# Patient Record
Sex: Female | Born: 1960 | Race: White | Hispanic: Yes | Marital: Married | Smoking: Current every day smoker
Health system: Southern US, Community
[De-identification: ages and names within clinical notes are randomized; demographics above are authoritative.]

## PROBLEM LIST (undated history)

## (undated) DIAGNOSIS — E785 Hyperlipidemia, unspecified: Secondary | ICD-10-CM

## (undated) DIAGNOSIS — J302 Other seasonal allergic rhinitis: Secondary | ICD-10-CM

## (undated) DIAGNOSIS — T7840XA Allergy, unspecified, initial encounter: Secondary | ICD-10-CM

## (undated) HISTORY — DX: Hyperlipidemia, unspecified: E78.5

## (undated) HISTORY — PX: OTHER SURGICAL HISTORY: SHX169

## (undated) HISTORY — PX: BILATERAL CARPAL TUNNEL RELEASE: SHX6508

## (undated) HISTORY — DX: Other seasonal allergic rhinitis: J30.2

## (undated) HISTORY — DX: Allergy, unspecified, initial encounter: T78.40XA

---

## 1992-10-19 HISTORY — PX: TUBAL LIGATION: SHX77

## 2002-10-19 HISTORY — PX: OTHER SURGICAL HISTORY: SHX169

## 2004-06-20 ENCOUNTER — Ambulatory Visit: Payer: Self-pay | Admitting: Family Medicine

## 2004-06-25 ENCOUNTER — Ambulatory Visit (HOSPITAL_COMMUNITY): Admission: RE | Admit: 2004-06-25 | Discharge: 2004-06-25 | Payer: Self-pay | Admitting: Family Medicine

## 2004-06-30 ENCOUNTER — Ambulatory Visit: Payer: Self-pay | Admitting: Family Medicine

## 2004-07-18 ENCOUNTER — Ambulatory Visit: Payer: Self-pay | Admitting: *Deleted

## 2004-10-07 ENCOUNTER — Ambulatory Visit: Payer: Self-pay | Admitting: Family Medicine

## 2004-10-27 ENCOUNTER — Ambulatory Visit (HOSPITAL_COMMUNITY): Admission: RE | Admit: 2004-10-27 | Discharge: 2004-10-27 | Payer: Self-pay | Admitting: Family Medicine

## 2005-12-11 ENCOUNTER — Ambulatory Visit: Payer: Self-pay | Admitting: Family Medicine

## 2006-01-07 ENCOUNTER — Ambulatory Visit: Payer: Self-pay | Admitting: Family Medicine

## 2006-01-08 ENCOUNTER — Other Ambulatory Visit: Admission: RE | Admit: 2006-01-08 | Discharge: 2006-01-08 | Payer: Self-pay | Admitting: Family Medicine

## 2006-01-08 ENCOUNTER — Encounter (INDEPENDENT_AMBULATORY_CARE_PROVIDER_SITE_OTHER): Payer: Self-pay | Admitting: Specialist

## 2006-07-05 ENCOUNTER — Ambulatory Visit: Payer: Self-pay | Admitting: Family Medicine

## 2006-07-09 ENCOUNTER — Ambulatory Visit (HOSPITAL_COMMUNITY): Admission: RE | Admit: 2006-07-09 | Discharge: 2006-07-09 | Payer: Self-pay | Admitting: Family Medicine

## 2006-07-30 ENCOUNTER — Encounter (INDEPENDENT_AMBULATORY_CARE_PROVIDER_SITE_OTHER): Payer: Self-pay | Admitting: Family Medicine

## 2006-07-30 ENCOUNTER — Ambulatory Visit: Payer: Self-pay | Admitting: Family Medicine

## 2006-11-01 ENCOUNTER — Ambulatory Visit: Payer: Self-pay | Admitting: Family Medicine

## 2006-11-03 ENCOUNTER — Ambulatory Visit (HOSPITAL_COMMUNITY): Admission: RE | Admit: 2006-11-03 | Discharge: 2006-11-03 | Payer: Self-pay | Admitting: Internal Medicine

## 2006-11-08 ENCOUNTER — Ambulatory Visit: Payer: Self-pay | Admitting: Family Medicine

## 2006-11-19 ENCOUNTER — Ambulatory Visit: Payer: Self-pay | Admitting: Family Medicine

## 2006-11-25 ENCOUNTER — Ambulatory Visit: Payer: Self-pay | Admitting: Internal Medicine

## 2006-12-02 ENCOUNTER — Ambulatory Visit: Payer: Self-pay | Admitting: Internal Medicine

## 2006-12-31 ENCOUNTER — Ambulatory Visit: Payer: Self-pay | Admitting: Family Medicine

## 2006-12-31 ENCOUNTER — Ambulatory Visit (HOSPITAL_COMMUNITY): Admission: RE | Admit: 2006-12-31 | Discharge: 2006-12-31 | Payer: Self-pay | Admitting: Family Medicine

## 2007-01-11 ENCOUNTER — Ambulatory Visit: Payer: Self-pay | Admitting: Family Medicine

## 2007-02-03 ENCOUNTER — Ambulatory Visit: Payer: Self-pay | Admitting: Family Medicine

## 2007-02-08 ENCOUNTER — Ambulatory Visit (HOSPITAL_COMMUNITY): Admission: RE | Admit: 2007-02-08 | Discharge: 2007-02-08 | Payer: Self-pay | Admitting: Family Medicine

## 2007-03-16 ENCOUNTER — Ambulatory Visit: Payer: Self-pay | Admitting: Family Medicine

## 2007-03-19 ENCOUNTER — Ambulatory Visit (HOSPITAL_COMMUNITY): Admission: RE | Admit: 2007-03-19 | Discharge: 2007-03-19 | Payer: Self-pay | Admitting: Family Medicine

## 2007-03-29 ENCOUNTER — Ambulatory Visit: Payer: Self-pay | Admitting: Family Medicine

## 2007-04-12 ENCOUNTER — Ambulatory Visit: Payer: Self-pay | Admitting: Family Medicine

## 2007-06-15 ENCOUNTER — Emergency Department (HOSPITAL_COMMUNITY): Admission: EM | Admit: 2007-06-15 | Discharge: 2007-06-16 | Payer: Self-pay | Admitting: Emergency Medicine

## 2007-07-06 ENCOUNTER — Encounter (INDEPENDENT_AMBULATORY_CARE_PROVIDER_SITE_OTHER): Payer: Self-pay | Admitting: *Deleted

## 2008-02-16 ENCOUNTER — Ambulatory Visit: Payer: Self-pay | Admitting: Internal Medicine

## 2008-03-19 ENCOUNTER — Encounter (INDEPENDENT_AMBULATORY_CARE_PROVIDER_SITE_OTHER): Payer: Self-pay | Admitting: Family Medicine

## 2008-03-19 ENCOUNTER — Ambulatory Visit: Payer: Self-pay | Admitting: Internal Medicine

## 2008-03-19 LAB — CONVERTED CEMR LAB
BUN: 16 mg/dL (ref 6–23)
CO2: 22 meq/L (ref 19–32)
Calcium: 9.4 mg/dL (ref 8.4–10.5)
Free T4: 1.25 ng/dL (ref 0.89–1.80)
Glucose, Bld: 97 mg/dL (ref 70–99)
Sodium: 141 meq/L (ref 135–145)
Total CHOL/HDL Ratio: 3.9
VLDL: 14 mg/dL (ref 0–40)

## 2008-03-26 ENCOUNTER — Ambulatory Visit (HOSPITAL_COMMUNITY): Admission: RE | Admit: 2008-03-26 | Discharge: 2008-03-26 | Payer: Self-pay | Admitting: Family Medicine

## 2008-05-22 ENCOUNTER — Ambulatory Visit: Payer: Self-pay | Admitting: Family Medicine

## 2008-05-28 ENCOUNTER — Ambulatory Visit (HOSPITAL_COMMUNITY): Admission: RE | Admit: 2008-05-28 | Discharge: 2008-05-28 | Payer: Self-pay | Admitting: Family Medicine

## 2009-02-04 ENCOUNTER — Ambulatory Visit: Payer: Self-pay | Admitting: Family Medicine

## 2009-10-22 ENCOUNTER — Ambulatory Visit: Payer: Self-pay | Admitting: Internal Medicine

## 2009-10-24 ENCOUNTER — Encounter: Admission: RE | Admit: 2009-10-24 | Discharge: 2009-10-24 | Payer: Self-pay | Admitting: Obstetrics and Gynecology

## 2010-11-12 ENCOUNTER — Other Ambulatory Visit (HOSPITAL_COMMUNITY): Payer: Self-pay | Admitting: Internal Medicine

## 2010-11-12 DIAGNOSIS — Z139 Encounter for screening, unspecified: Secondary | ICD-10-CM

## 2010-11-25 ENCOUNTER — Ambulatory Visit (HOSPITAL_COMMUNITY)
Admission: RE | Admit: 2010-11-25 | Discharge: 2010-11-25 | Disposition: A | Discharge status: Home / Self Care | Payer: Self-pay | Source: Ambulatory Visit | Attending: Internal Medicine | Admitting: Internal Medicine

## 2010-11-25 DIAGNOSIS — Z1231 Encounter for screening mammogram for malignant neoplasm of breast: Secondary | ICD-10-CM | POA: Insufficient documentation

## 2010-11-25 DIAGNOSIS — Z139 Encounter for screening, unspecified: Secondary | ICD-10-CM

## 2010-12-03 ENCOUNTER — Other Ambulatory Visit: Payer: Self-pay | Admitting: Internal Medicine

## 2010-12-03 DIAGNOSIS — R928 Other abnormal and inconclusive findings on diagnostic imaging of breast: Secondary | ICD-10-CM

## 2011-01-06 ENCOUNTER — Ambulatory Visit
Admission: RE | Admit: 2011-01-06 | Discharge: 2011-01-06 | Disposition: A | Discharge status: Home / Self Care | Payer: Self-pay | Source: Ambulatory Visit | Attending: Internal Medicine | Admitting: Internal Medicine

## 2011-01-06 ENCOUNTER — Other Ambulatory Visit: Payer: Self-pay | Admitting: Internal Medicine

## 2011-01-06 DIAGNOSIS — R928 Other abnormal and inconclusive findings on diagnostic imaging of breast: Secondary | ICD-10-CM

## 2011-07-31 LAB — I-STAT 8, (EC8 V) (CONVERTED LAB)
Acid-base deficit: 1
Operator id: 277751
Potassium: 3.3 — ABNORMAL LOW
TCO2: 20
pCO2, Ven: 24.1 — ABNORMAL LOW
pH, Ven: 7.522 — ABNORMAL HIGH

## 2011-07-31 LAB — D-DIMER, QUANTITATIVE: D-Dimer, Quant: 0.31

## 2011-07-31 LAB — POCT CARDIAC MARKERS
Myoglobin, poc: 44
Operator id: 277751
Operator id: 277751
Troponin i, poc: <0.05

## 2011-10-14 ENCOUNTER — Other Ambulatory Visit (HOSPITAL_COMMUNITY)
Admission: RE | Admit: 2011-10-14 | Discharge: 2011-10-14 | Disposition: A | Discharge status: Home / Self Care | Payer: Self-pay | Source: Ambulatory Visit | Attending: Dermatology | Admitting: Dermatology

## 2011-10-14 ENCOUNTER — Other Ambulatory Visit: Payer: Self-pay | Admitting: Family Medicine

## 2011-10-14 DIAGNOSIS — Z01419 Encounter for gynecological examination (general) (routine) without abnormal findings: Secondary | ICD-10-CM | POA: Insufficient documentation

## 2014-11-05 ENCOUNTER — Ambulatory Visit: Payer: Self-pay

## 2015-07-23 ENCOUNTER — Encounter (HOSPITAL_COMMUNITY): Payer: Self-pay | Admitting: Emergency Medicine

## 2015-07-23 ENCOUNTER — Emergency Department (HOSPITAL_COMMUNITY)
Admission: EM | Admit: 2015-07-23 | Discharge: 2015-07-24 | Disposition: A | Discharge status: Home / Self Care | Payer: Self-pay | Attending: Emergency Medicine | Admitting: Emergency Medicine

## 2015-07-23 ENCOUNTER — Emergency Department (HOSPITAL_COMMUNITY): Payer: Self-pay

## 2015-07-23 DIAGNOSIS — Z720 Tobacco use: Secondary | ICD-10-CM | POA: Insufficient documentation

## 2015-07-23 DIAGNOSIS — H02843 Edema of right eye, unspecified eyelid: Secondary | ICD-10-CM | POA: Insufficient documentation

## 2015-07-23 DIAGNOSIS — T782XXA Anaphylactic shock, unspecified, initial encounter: Secondary | ICD-10-CM

## 2015-07-23 DIAGNOSIS — H02846 Edema of left eye, unspecified eyelid: Secondary | ICD-10-CM | POA: Insufficient documentation

## 2015-07-23 DIAGNOSIS — X58XXXA Exposure to other specified factors, initial encounter: Secondary | ICD-10-CM | POA: Insufficient documentation

## 2015-07-23 DIAGNOSIS — R070 Pain in throat: Secondary | ICD-10-CM | POA: Insufficient documentation

## 2015-07-23 DIAGNOSIS — R002 Palpitations: Secondary | ICD-10-CM | POA: Insufficient documentation

## 2015-07-23 NOTE — ED Notes (Signed)
Pt. reports SOB and blaiteral eye lids swelling onset this evening , airway intact , O2 sat= 100% room air at arrival , no tongue swelling .

## 2015-07-24 MED ORDER — PREDNISONE 20 MG PO TABS
60.0000 mg | ORAL_TABLET | Freq: Once | ORAL | Status: AC
  Administered 2015-07-24: 60 mg via ORAL
  Filled 2015-07-24: qty 3

## 2015-07-24 MED ORDER — EPINEPHRINE 0.3 MG/0.3ML IJ SOAJ: 0.3000 mg | INTRAMUSCULAR | Status: DC | PRN

## 2015-07-24 MED ORDER — EPINEPHRINE 0.3 MG/0.3ML IJ SOAJ
0.3000 mg | Freq: Once | INTRAMUSCULAR | Status: AC
  Administered 2015-07-24: 0.3 mg via INTRAMUSCULAR
  Filled 2015-07-24: qty 0.3

## 2015-07-24 MED ORDER — DIPHENHYDRAMINE HCL 25 MG PO CAPS
50.0000 mg | ORAL_CAPSULE | Freq: Once | ORAL | Status: AC
  Administered 2015-07-24: 50 mg via ORAL
  Filled 2015-07-24: qty 2

## 2015-07-24 MED ORDER — PREDNISONE 20 MG PO TABS: 60.0000 mg | ORAL_TABLET | Freq: Every day | ORAL | Status: DC

## 2015-07-24 MED ORDER — RANITIDINE HCL 150 MG/10ML PO SYRP
300.0000 mg | ORAL_SOLUTION | Freq: Once | ORAL | Status: AC
  Administered 2015-07-24: 300 mg via ORAL
  Filled 2015-07-24: qty 20

## 2015-07-24 NOTE — ED Provider Notes (Signed)
CSN: [id number]     Arrival date & time 07/23/15  2236 History  By signing my name below, I, Freida Busman, attest that this documentation has been prepared under the direction and in the presence of Tomasita Crumble, MD . Electronically Signed: Freida Busman, Scribe. 07/24/2015. 1:19 AM.      Chief Complaint  Patient presents with  . Shortness of Breath   The history is provided by the patient. No language interpreter was used.    HPI Comments:  Tonya White is a 54 y.o. female who presents to the Emergency Department complaining of mild-moderate throat tightness and SOB that started about 2130 last night (07/23/15) while getting ready for bed. She reports associated swelling of her bilateral eyes and palpitations. She denies pain, itching, and rash. She also denies exposure to new chemicals at work, use of new lotions and detergents/soap. No alleviating factors noted. No history of allergic reactions; NKA.  History reviewed. No pertinent past medical history. History reviewed. No pertinent past surgical history. No family history on file. Social History  Substance Use Topics  . Smoking status: Current Every Day Smoker  . Smokeless tobacco: None  . Alcohol Use: No   OB History    No data available     Review of Systems  A complete 10 system review of systems was obtained and all systems are negative except as noted in the HPI and PMH.   Allergies  Review of patient's allergies indicates no known allergies.  Home Medications   Prior to Admission medications   Not on File   BP 122/71 mmHg  Pulse 69  Temp(Src) 98.1 F (36.7 C) (Oral)  Resp 16  SpO2 100% Physical Exam  Constitutional: She is oriented to person, place, and time. She appears well-developed and well-nourished. No distress.  HENT:  Head: Normocephalic and atraumatic.  Nose: Nose normal.  Mouth/Throat: Oropharynx is clear and moist. No oropharyngeal exudate.  No evidence of oral swelling; no uvula  deviance   Eyes: Conjunctivae and EOM are normal. Pupils are equal, round, and reactive to light. No scleral icterus.  There is swelling to the face diffusely worse around bilateral eyelids  Neck: Normal range of motion. Neck supple. No JVD present. No tracheal deviation present. No thyromegaly present.  Cardiovascular: Normal rate, regular rhythm and normal heart sounds.  Exam reveals no gallop and no friction rub.   No murmur heard. Pulmonary/Chest: Effort normal and breath sounds normal. No respiratory distress. She has no wheezes. She exhibits no tenderness.  Abdominal: Soft. Bowel sounds are normal. She exhibits no distension and no mass. There is no tenderness. There is no rebound and no guarding.  Musculoskeletal: Normal range of motion. She exhibits no edema or tenderness.  Lymphadenopathy:    She has no cervical adenopathy.  Neurological: She is alert and oriented to person, place, and time. No cranial nerve deficit. She exhibits normal muscle tone.  Skin: Skin is warm and dry. No rash noted. No erythema. No pallor.  Nursing note and vitals reviewed.   ED Course  Procedures   DIAGNOSTIC STUDIES:  Oxygen Saturation is 100% on RA, normal by my interpretation.    COORDINATION OF CARE:  12:38 AM Discussed treatment plan with pt at bedside and pt agreed to plan.  Labs Review Labs Reviewed - No data to display  Imaging Review Dg Chest 2 View  07/23/2015   CLINICAL DATA:  Short of breath and weakness with palpitations  EXAM: CHEST  2 VIEW  COMPARISON:  None.  FINDINGS: Normal heart size. Calcified granuloma in the superior segment of the left lower lobe. Otherwise clear lungs. No pneumothorax or pleural effusion.  IMPRESSION: No active cardiopulmonary disease.   Electronically Signed   By: Jolaine Click M.D.   On: 07/23/2015 23:57   I have personally reviewed and evaluated these images as part of my medical decision-making.   EKG Interpretation   Date/Time:  Tuesday July 23 2015 22:50:11 EDT Ventricular Rate:  88 PR Interval:  164 QRS Duration: 88 QT Interval:  372 QTC Calculation: 450 R Axis:   77 Text Interpretation:  Normal sinus rhythm Normal ECG No old tracing to  compare Confirmed by Erroll Luna [phone]) on 07/23/2015 11:46:33  PM      MDM   Final diagnoses:  None   Patient presents to the emergency department for an acute allergic reaction. She is unable to determine what she had an allergy to. She was advised to keep a watch for what might be the cause. She was given epinephrine injection, ranitidine, prednisone, Benadryl for treatment. Upon repeat evaluation approximately 3 hours later patient swelling in her face has improved. She states the swelling in her throat is completely gone. She'll be discharged with prednisone prescription for the next 4 days. Epinephrine pen prescription was given as well. Return precautions given, she appears well and in no acute distress, her vital signs remain within her normal limits and she is safe for discharge.    I, Wynston Romey, personally performed the services described in this documentation. All medical record entries made by the scribe were at my direction and in my presence.  I have reviewed the chart and discharge instructions and agree that the record reflects my personal performance and is accurate and complete. Adante [place]ington.  07/24/2015. 2:07 AM.     Tomasita Crumble, MD 07/24/15 [phone]

## 2015-07-24 NOTE — Discharge Instructions (Signed)
Reaccin anafilctica  (Anaphylactic Reaction)  Ms. Tonya White, take prednisone for the next 4 days to complete your treatment of the allergic reaction. Use the epinephrine pen if your symptoms come back and then come back to the emergency department immediately. See a primary care physician within 3 days for close follow-up. If any symptoms worsen come back to emergency department immediately. Thank you.   [name], tomar prednisona [employer] prximos 4 das para completar su tratamiento de la Risk analyst. Utilice el estuche de epinefrina si sus sntomas vuelven a aparecer y luego vuelven al departamento de emergencias. Consulte a un mdico de atencin primaria dentro de los 3 das de un seguimiento Physicist, medical. Si los sntomas empeoran cualquier vuelven a departamento de emergencias. Gracias.    La reaccin anafilctica es una reaccin alrgica sbita y grave. Afecta a todo el organismo. Puede poner en peligro la vida. Ser necesario que Music therapist hospital.  CUIDADOS EN EL HOGAR   Utilice un brazalete o collar de alerta mdico, indicando el tipo de [place].  Lleve con usted un kit para la alergia o un medicamento inyectable para tratar las Chief of Staff graves. Esto puede salvar su vida.  No conduzca vehculos hasta que los efectos de la inyeccin hayan desaparecido, excepto que el mdico lo autorice.  Si tiene urticaria o erupcin cutnea:  Tome los [employer] como le indic el mdico.  Puede tomar antihistamnicos de venta libre  Aplique paos fros sobre la piel. Tome un bao de agua fresca. Evite los [place] calientes. SOLICITE AYUDA DE INMEDIATO SI:   Tiene la boca inflamada (hinchada) o dificultad para respirar.  Comienza a emitir un sonido agudo al respirar (sibilancias).  Tiene una sensacin de opresin en el pecho o en la garganta.  Tiene un sarpullido, hinchazn o picazn en todo el cuerpo.  Vomita o tiene deposiciones  acuosas.(diarrea).  Se siente dbil o se desvanece (se desmaya).  Piensa que tiene Nurse, mental health.  Aparecen nuevos sntomas. Esto es Engineer, maintenance (IT). Utilice la inyeccin o el kit para la alergia segn las indicaciones. Llame a los servicios de emergencia locales (911 en [place]). Aunque despus de la inyeccin se sienta mejor, debe concurrir al servicio de emergencias del hospital.  ASEGRESE DE QUE:   Comprende estas instrucciones.  Controlar su enfermedad.  Solicitar ayuda de inmediato si no mejora o si empeora.   Esta informacin no tiene Marine scientist el consejo del mdico. Asegrese de hacerle al mdico cualquier pregunta que tenga.   Document Released: 04/05/2012 Elsevier Interactive Patient Education [employer].

## 2015-07-24 NOTE — ED Notes (Signed)
Gave pt Ginger Ale, per [place] - RN

## 2015-07-24 NOTE — ED Notes (Signed)
Pt c/o bilateral edema to eyelids, shortness of breath, and tightness in neck onset at 9pm. Pt became more concerned with swelling started around eyes. Denies visual disturbances. Pt also denies any new perfumes or laundry detergents

## 2017-01-01 ENCOUNTER — Emergency Department (HOSPITAL_COMMUNITY): Payer: Self-pay

## 2017-01-01 ENCOUNTER — Encounter (HOSPITAL_COMMUNITY): Payer: Self-pay

## 2017-01-01 DIAGNOSIS — H02841 Edema of right upper eyelid: Secondary | ICD-10-CM | POA: Insufficient documentation

## 2017-01-01 DIAGNOSIS — Z79899 Other long term (current) drug therapy: Secondary | ICD-10-CM | POA: Insufficient documentation

## 2017-01-01 DIAGNOSIS — R002 Palpitations: Secondary | ICD-10-CM | POA: Insufficient documentation

## 2017-01-01 DIAGNOSIS — F17200 Nicotine dependence, unspecified, uncomplicated: Secondary | ICD-10-CM | POA: Insufficient documentation

## 2017-01-01 LAB — BASIC METABOLIC PANEL
Anion gap: 12 (ref 5–15)
BUN: 15 mg/dL (ref 6–20)
CHLORIDE: 107 mmol/L (ref 101–111)
CO2: 22 mmol/L (ref 22–32)
CREATININE: 0.71 mg/dL (ref 0.44–1.00)
Calcium: 9.2 mg/dL (ref 8.9–10.3)
GFR calc Af Amer: >60 mL/min (ref >60)
GFR calc non Af Amer: >60 mL/min (ref >60)
GLUCOSE: 116 mg/dL — AB (ref 65–99)
POTASSIUM: 3.6 mmol/L (ref 3.5–5.1)
Sodium: 141 mmol/L (ref 135–145)

## 2017-01-01 LAB — I-STAT TROPONIN, ED: Troponin i, poc: 0 ng/mL (ref 0.00–0.08)

## 2017-01-01 LAB — CBC
HEMATOCRIT: 39.1 % (ref 36.0–46.0)
Hemoglobin: 12.8 g/dL (ref 12.0–15.0)
MCH: 29.6 pg (ref 26.0–34.0)
MCHC: 32.7 g/dL (ref 30.0–36.0)
MCV: 90.5 fL (ref 78.0–100.0)
Platelets: 327 10*3/uL (ref 150–400)
RBC: 4.32 MIL/uL (ref 3.87–5.11)
RDW: 13.5 % (ref 11.5–15.5)
WBC: 6.2 10*3/uL (ref 4.0–10.5)

## 2017-01-01 NOTE — ED Triage Notes (Signed)
Pt states that she went to check the mail and became very SOB and feels like her heart is beating really fast, pt states that she feels like her face is swollen, no visible swelling noted, pt also stated when she couldn't catch her breath she felt like her throat was tight, denies and new medications. Speaks in full sentences.

## 2017-01-02 ENCOUNTER — Emergency Department (HOSPITAL_COMMUNITY)
Admission: EM | Admit: 2017-01-02 | Discharge: 2017-01-02 | Disposition: A | Discharge status: Home / Self Care | Payer: Self-pay | Attending: Emergency Medicine | Admitting: Emergency Medicine

## 2017-01-02 DIAGNOSIS — R002 Palpitations: Secondary | ICD-10-CM

## 2017-01-02 DIAGNOSIS — H02843 Edema of right eye, unspecified eyelid: Secondary | ICD-10-CM

## 2017-01-02 LAB — I-STAT TROPONIN, ED: TROPONIN I, POC: 0 ng/mL (ref 0.00–0.08)

## 2017-01-02 MED ORDER — ERYTHROMYCIN 5 MG/GM OP OINT: TOPICAL_OINTMENT | OPHTHALMIC | 0 refills | Status: DC

## 2017-01-02 MED ORDER — CETIRIZINE HCL 10 MG PO TABS: 10.0000 mg | ORAL_TABLET | Freq: Every day | ORAL | 1 refills | Status: DC

## 2017-01-02 NOTE — ED Notes (Signed)
ED Provider at bedside. 

## 2017-01-02 NOTE — Discharge Instructions (Signed)
Follow up with a primary care doctor, the radiologist recommended a follow up xray in a few months to make sure an incidental nodule in the left upper lobe is not changing in size,   take the medications as prescribed, return as needed for worsening symptoms

## 2017-01-02 NOTE — ED Provider Notes (Signed)
[facility]-EMERGENCY DEPT Provider Note   CSN: [id number] Arrival date & time: 01/01/17  2253  By signing my name below, I, Nelwyn Salisbury, attest that this documentation has been prepared under the direction and in the presence of Linwood Dibbles, MD . Electronically Signed: Nelwyn Salisbury, Scribe. 01/02/2017. 2:56 AM.  History   Chief Complaint Chief Complaint  Patient presents with  . Shortness of Breath  . Chest Pain   The history is provided by the patient and a relative. The history is limited by a language barrier. No language interpreter was used.    HPI Comments:  Tonya White is an otherwise healthy 56 y.o. female who presents to the Emergency Department complaining of sudden-onset, constant, unchanged facial swelling which began about 6 hours ago. She notes that her swelling is localized to her eyelids. Pt reports associated SOB and chest palpitations. She states that she was walking to get the mail when her symptoms began. Pt also describes a sensation that her neck was throbbing. She denies any chest pain or abdominal pain.   History reviewed. No pertinent past medical history.  There are no active problems to display for this patient.   History reviewed. No pertinent surgical history.  OB History    No data available       Home Medications    Prior to Admission medications   Medication Sig Start Date End Date Taking? Authorizing Provider  cetirizine (ZYRTEC ALLERGY) 10 MG tablet Take 1 tablet (10 mg total) by mouth daily. 01/02/17   Linwood Dibbles, MD  EPINEPHrine 0.3 mg/0.3 mL IJ SOAJ injection Inject 0.3 mLs (0.3 mg total) into the muscle as needed (allergic reaction). 07/24/15   Tomasita Crumble, MD  erythromycin ophthalmic ointment Place a 1/2 inch ribbon of ointment into the lower eyelid. 01/02/17   Linwood Dibbles, MD  predniSONE (DELTASONE) 20 MG tablet Take 3 tablets (60 mg total) by mouth daily. 07/24/15   Tomasita Crumble, MD    Family History No family history on  file.  Social History Social History  Substance Use Topics  . Smoking status: Current Every Day Smoker  . Smokeless tobacco: Never Used  . Alcohol use No     Allergies   Patient has no known allergies.   Review of Systems Review of Systems  HENT: Positive for facial swelling.   Respiratory: Positive for shortness of breath.   Cardiovascular: Positive for palpitations. Negative for chest pain.  Gastrointestinal: Negative for abdominal pain.  All other systems reviewed and are negative.    Physical Exam Updated Vital Signs BP 123/63   Pulse (!) 55   Temp 98.1 F (36.7 C)   Resp 13   SpO2 98%   Physical Exam  Constitutional: She appears well-developed and well-nourished. No distress.  HENT:  Head: Normocephalic and atraumatic.  Right Ear: External ear normal.  Left Ear: External ear normal.  Eyes: Conjunctivae are normal. Right eye exhibits no discharge. Left eye exhibits no discharge. No scleral icterus.  Mild edema right upper eyelid  No exudate. No conjunctival injection.  Neck: Neck supple. No tracheal deviation present.  Cardiovascular: Normal rate, regular rhythm and intact distal pulses.   Pulmonary/Chest: Effort normal and breath sounds normal. No stridor. No respiratory distress. She has no wheezes. She has no rales.  Abdominal: Soft. Bowel sounds are normal. She exhibits no distension. There is no tenderness. There is no rebound and no guarding.  Musculoskeletal: She exhibits no edema or tenderness.  Neurological: She is alert.  She has normal strength. No cranial nerve deficit (no facial droop, extraocular movements intact, no slurred speech) or sensory deficit. She exhibits normal muscle tone. She displays no seizure activity. Coordination normal.  Skin: Skin is warm and dry. No rash noted.  Psychiatric: She has a normal mood and affect.  Nursing note and vitals reviewed.    ED Treatments / Results  DIAGNOSTIC STUDIES:  Oxygen Saturation is 98% on A,  normal by my interpretation.    COORDINATION OF CARE:  3:28 AM Discussed treatment plan with pt at bedside which includes antihistamines and repeat blood work and pt agreed to plan.  Labs (all labs ordered are listed, but only abnormal results are displayed) Labs Reviewed  BASIC METABOLIC PANEL - Abnormal; Notable for the following:       Result Value   Glucose, Bld 116 (*)    All other components within normal limits  CBC  I-STAT TROPOININ, ED  I-STAT TROPOININ, ED    EKG  EKG Interpretation  Date/Time:  Friday January 01 2017 22:59:50 EDT Ventricular Rate:  73 PR Interval:  200 QRS Duration: 88 QT Interval:  402 QTC Calculation: 442 R Axis:   55 Text Interpretation:  Normal sinus rhythm Normal ECG When compared with ECG of 06/15/2007, No significant change was found Confirmed by [facility] Mem HospGLICK  MD, DAVID ([id number]) on 01/02/2017 2:30:56 AM       Radiology Dg Chest 2 View  Result Date: 01/01/2017 CLINICAL DATA:  56 year old female with shortness of breath. EXAM: CHEST  2 VIEW COMPARISON:  None. FINDINGS: The lungs are clear. There is a 5 mm left upper lobe nodule, most likely a calcified granuloma. Direct comparison with prior images, if available, or follow-up recommended to document stability. There is no pleural effusion or pneumothorax. The cardiac silhouette is within normal limits. No acute osseous pathology identified. IMPRESSION: 1. No acute cardiopulmonary process. 2. Probable 5 mm left upper lobe granuloma. Direct comparison with prior images or follow-up recommended to document stability. Electronically Signed   By: Elgie CollardArash  Radparvar M.D.   On: 01/01/2017 23:45    Procedures Procedures (including critical care time)  Medications Ordered in ED Medications - No data to display   Initial Impression / Assessment and Plan / ED Course  I have reviewed the triage vital signs and the nursing notes.  Pertinent labs & imaging results that were available during my care of the patient  were reviewed by me and considered in my medical decision making (see chart for details).  Pt presented with complaints of palpitations. Denies any chest pain.  ED evaluation reassuring.  Normal EKG.  Also noted to have eyelid swelling.  No definite stye.  Will dc home with antihistamines,  Eye ointment.  Follow up with PCP  Final Clinical Impressions(s) / ED Diagnoses   Final diagnoses:  Chest pain, unspecified type  Eyelid edema, right    New Prescriptions New Prescriptions   CETIRIZINE (ZYRTEC ALLERGY) 10 MG TABLET    Take 1 tablet (10 mg total) by mouth daily.   ERYTHROMYCIN OPHTHALMIC OINTMENT    Place a 1/2 inch ribbon of ointment into the lower eyelid.  I personally performed the services described in this documentation, which was scribed in my presence.  The recorded information has been reviewed and is accurate.     Linwood DibblesJon Pio Eatherly, MD 01/02/17 [phone]

## 2019-01-30 ENCOUNTER — Other Ambulatory Visit: Payer: Self-pay

## 2019-01-30 ENCOUNTER — Other Ambulatory Visit: Payer: Self-pay | Admitting: Family Medicine

## 2019-01-30 ENCOUNTER — Ambulatory Visit: Payer: Self-pay

## 2019-01-30 DIAGNOSIS — M79605 Pain in left leg: Secondary | ICD-10-CM

## 2019-10-20 HISTORY — PX: MOUTH SURGERY: SHX715

## 2020-06-21 ENCOUNTER — Other Ambulatory Visit: Payer: Self-pay | Admitting: *Deleted

## 2020-06-21 ENCOUNTER — Other Ambulatory Visit: Payer: Self-pay

## 2020-06-21 DIAGNOSIS — Z20822 Contact with and (suspected) exposure to covid-19: Secondary | ICD-10-CM

## 2020-06-22 LAB — NOVEL CORONAVIRUS, NAA: SARS-CoV-2, NAA: NOT DETECTED

## 2020-06-25 ENCOUNTER — Telehealth: Payer: Self-pay | Admitting: Pediatric Intensive Care

## 2020-06-25 NOTE — Telephone Encounter (Signed)
Call to client with Tonya White, [employer] case manager. Client states that entire household has received COVID test results except her. Family members are all positive. She would like advise regarding quarantine. She also states that her husband Tonya White has MS. CN is familiar with her husband and advised that he call his PCP immediately to notify of COVID status. Shann Medal RN BSN CNP [phone]

## 2020-06-28 ENCOUNTER — Other Ambulatory Visit: Payer: Self-pay

## 2020-06-28 DIAGNOSIS — Z20822 Contact with and (suspected) exposure to covid-19: Secondary | ICD-10-CM

## 2020-07-01 LAB — NOVEL CORONAVIRUS, NAA: SARS-CoV-2, NAA: NOT DETECTED

## 2020-11-28 ENCOUNTER — Emergency Department (HOSPITAL_COMMUNITY)
Admission: EM | Admit: 2020-11-28 | Discharge: 2020-11-28 | Disposition: A | Discharge status: Home / Self Care | Payer: Self-pay | Attending: Emergency Medicine | Admitting: Emergency Medicine

## 2020-11-28 ENCOUNTER — Encounter (HOSPITAL_COMMUNITY): Payer: Self-pay | Admitting: Emergency Medicine

## 2020-11-28 ENCOUNTER — Other Ambulatory Visit: Payer: Self-pay

## 2020-11-28 DIAGNOSIS — R220 Localized swelling, mass and lump, head: Secondary | ICD-10-CM | POA: Insufficient documentation

## 2020-11-28 DIAGNOSIS — T7840XA Allergy, unspecified, initial encounter: Secondary | ICD-10-CM | POA: Insufficient documentation

## 2020-11-28 DIAGNOSIS — R0602 Shortness of breath: Secondary | ICD-10-CM | POA: Insufficient documentation

## 2020-11-28 DIAGNOSIS — F17200 Nicotine dependence, unspecified, uncomplicated: Secondary | ICD-10-CM | POA: Insufficient documentation

## 2020-11-28 MED ORDER — DEXAMETHASONE SODIUM PHOSPHATE 10 MG/ML IJ SOLN
10.0000 mg | Freq: Once | INTRAMUSCULAR | Status: AC
  Administered 2020-11-28: 10 mg via INTRAVENOUS
  Filled 2020-11-28: qty 1

## 2020-11-28 MED ORDER — EPINEPHRINE 0.3 MG/0.3ML IJ SOAJ
0.3000 mg | Freq: Once | INTRAMUSCULAR | Status: AC
  Administered 2020-11-28: 0.3 mg via INTRAMUSCULAR
  Filled 2020-11-28: qty 0.3

## 2020-11-28 MED ORDER — DIPHENHYDRAMINE HCL 50 MG/ML IJ SOLN
50.0000 mg | Freq: Once | INTRAMUSCULAR | Status: AC
  Administered 2020-11-28: 50 mg via INTRAVENOUS
  Filled 2020-11-28: qty 1

## 2020-11-28 MED ORDER — EPINEPHRINE 0.3 MG/0.3ML IJ SOAJ: 0.3000 mg | INTRAMUSCULAR | 2 refills | Status: AC | PRN

## 2020-11-28 NOTE — ED Triage Notes (Signed)
C/o eyes swelling and numbness in throat, with difficulty breathing. At present time - periorbital swelling--

## 2020-11-28 NOTE — ED Notes (Signed)
Pt is feeling calmer and states that the numb feeling in her face is improving.

## 2020-11-28 NOTE — ED Notes (Signed)
DC instructions reviewed with the pt.  PT verbalized understanding.  Pt DC.  

## 2020-11-28 NOTE — ED Provider Notes (Signed)
[facility] EMERGENCY DEPARTMENT Provider Note   CSN: [id number] Arrival date & time: 11/28/20  1525     History Chief Complaint  Patient presents with  . Shortness of Breath  . Allergic Reaction    Tonya White Tonya White is a 60 y.o. female with no significant history who presents to the ED for facial swelling. Patient in shower at approximately 1445 today when she noticed swelling around her eyes as well as numbness and scratching-sensation in throat. Felt somewhat SOB with symptoms. Denies wheezing, N/V/D, abdominal pain, rash, itchiness, or headache. No history of allergic reactions. No new exposures at home that patient is aware of. She has not taken anything for her symptoms. Patient in usual state of health prior to this incident.   The history is provided by the patient and medical records. The history is limited by a language barrier. A language interpreter was used.  Allergic Reaction Presenting symptoms: swelling   Presenting symptoms: no rash   Severity:  Mild Duration:  1 hour Prior allergic episodes:  No prior episodes Context comment:  Unknown - spontaneous Relieved by:  None tried Worsened by:  Nothing Ineffective treatments:  None tried   History reviewed. No pertinent past medical history.  There are no problems to display for this patient.   History reviewed. No pertinent surgical history.   OB History   No obstetric history on file.     No family history on file.  Social History   Tobacco Use  . Smoking status: Current Every Day Smoker  . Smokeless tobacco: Never Used  Substance Use Topics  . Alcohol use: No  . Drug use: No    Home Medications Prior to Admission medications   Medication Sig Start Date End Date Taking? Authorizing Provider  EPINEPHrine 0.3 mg/0.3 mL IJ SOAJ injection Inject 0.3 mg into the muscle as needed for anaphylaxis. 11/28/20  Yes Tonia Brooms, MD  cetirizine (ZYRTEC ALLERGY) 10 MG tablet Take 1  tablet (10 mg total) by mouth daily. 01/02/17   Linwood Dibbles, MD  EPINEPHrine 0.3 mg/0.3 mL IJ SOAJ injection Inject 0.3 mLs (0.3 mg total) into the muscle as needed (allergic reaction). 07/24/15   Tomasita Crumble, MD  erythromycin ophthalmic ointment Place a 1/2 inch ribbon of ointment into the lower eyelid. 01/02/17   Linwood Dibbles, MD  predniSONE (DELTASONE) 20 MG tablet Take 3 tablets (60 mg total) by mouth daily. 07/24/15   Tomasita Crumble, MD    Allergies    Patient has no known allergies.  Review of Systems   Review of Systems  Constitutional: Negative for chills and fever.  HENT: Positive for facial swelling. Negative for ear pain and sore throat.   Eyes: Positive for redness. Negative for pain, discharge, itching and visual disturbance.  Respiratory: Positive for shortness of breath. Negative for cough.   Cardiovascular: Negative for chest pain and palpitations.  Gastrointestinal: Negative for abdominal pain, diarrhea, nausea and vomiting.  Genitourinary: Negative for dysuria and hematuria.  Musculoskeletal: Negative for arthralgias and back pain.  Skin: Negative for color change and rash.  Neurological: Negative for seizures and syncope.  All other systems reviewed and are negative.   Physical Exam Updated Vital Signs BP 113/73 (BP Location: Left Arm)   Pulse 76   Temp 98.4 F (36.9 C) (Oral)   Resp 17   Ht 5' (1.524 m)   Wt 62.6 kg   SpO2 97%   BMI 26.95 kg/m   Physical Exam Vitals and  nursing note reviewed.  Constitutional:      General: She is not in acute distress.    Appearance: Normal appearance. She is well-developed and well-nourished. She is not ill-appearing or diaphoretic.  HENT:     Head: Normocephalic and atraumatic.     Jaw: There is normal jaw occlusion. No swelling.     Comments: BLT periorbital swelling. Able to open eyes. Conjunctival injection to R. No discharge.    Right Ear: External ear normal.     Left Ear: External ear normal.     Nose: Nose normal.  No congestion or rhinorrhea.     Mouth/Throat:     Lips: Pink. No lesions.     Mouth: Mucous membranes are moist. No oral lesions or angioedema.     Tongue: No lesions.     Palate: No lesions.     Pharynx: Oropharynx is clear. Uvula midline. No pharyngeal swelling, oropharyngeal exudate, posterior oropharyngeal erythema or uvula swelling.     Tonsils: No tonsillar exudate or tonsillar abscesses.  Eyes:     General:        Right eye: No discharge.        Left eye: No discharge.  Cardiovascular:     Rate and Rhythm: Normal rate and regular rhythm.     Heart sounds: No murmur heard.   Pulmonary:     Effort: Pulmonary effort is normal. No respiratory distress.     Breath sounds: Normal breath sounds. No stridor. No wheezing or rhonchi.  Chest:     Chest wall: No tenderness.  Abdominal:     General: Abdomen is flat. There is no distension.     Palpations: Abdomen is soft.     Tenderness: There is no abdominal tenderness. There is no guarding or rebound.  Musculoskeletal:        General: No edema.     Cervical back: Neck supple.     Right lower leg: No edema.     Left lower leg: No edema.  Skin:    General: Skin is warm and dry.     Findings: No rash.  Neurological:     General: No focal deficit present.     Mental Status: She is alert and oriented to person, place, and time.  Psychiatric:        Mood and Affect: Mood and affect and mood normal.        Behavior: Behavior normal.     ED Results / Procedures / Treatments   Labs (all labs ordered are listed, but only abnormal results are displayed) Labs Reviewed - No data to display  EKG None  Radiology No results found.  Procedures Procedures   Medications Ordered in ED Medications  EPINEPHrine (EPI-PEN) injection 0.3 mg (0.3 mg Intramuscular Given 11/28/20 1628)  dexamethasone (DECADRON) injection 10 mg (10 mg Intravenous Given 11/28/20 1627)  diphenhydrAMINE (BENADRYL) injection 50 mg (50 mg Intravenous Given  11/28/20 1627)    ED Course  I have reviewed the triage vital signs and the nursing notes.  Pertinent labs & imaging results that were available during my care of the patient were reviewed by me and considered in my medical decision making (see chart for details).    MDM Rules/Calculators/A&P                          Patient is a 59yF with a history and physical as described above who presents to the ED for anaphylaxis reaction.  VS reassuring and HDS. She is overall well-appearing, non-toxic, and no acute distress. Given presenting symptoms, IM epi x1, Solumedrol, and Benadryl given with significant improvement of symptoms.  Following 4-hour observation period following EpiPen administration, symptoms continued to improve. No complaint of neck swelling, trouble breathing, or other systemic symptoms since initial dose of Epi. HPI and exam consistent with anaphylactic reaction. Doubt SJS/TEN, angioedema, nephritic/nephrotic syndrome, sepsis, or trauma. Recommend patient continuing taking Benadryl scheduled at home for the next 24 hours. Will prescribe EpiPen for home use. Patient otherwise remained HDS and appropriate for discharge home. Recommend follow up with PCP for allergist referral. Strict return precautions provided and discussed. Questions and concerns were addressed. Patient verbalized understanding and amenable with discharge plan. Patient discharged in stable condition.  Final Clinical Impression(s) / ED Diagnoses Final diagnoses:  Allergic reaction, initial encounter    Rx / DC Orders ED Discharge Orders         Ordered    EPINEPHrine 0.3 mg/0.3 mL IJ SOAJ injection  As needed        11/28/20 2039           Tonia Brooms, MD 11/29/20 5643    Cathren Laine, MD 11/29/20 1440

## 2020-11-28 NOTE — ED Notes (Signed)
Pt felt her heart speed up and increased her respiratory rate as expected after epi-pen. Pt instructed to take slow deep breaths.

## 2020-11-28 NOTE — Discharge Instructions (Signed)
Please take Benadryl at home every 4-6 hours for the next 24 hours.

## 2020-11-28 NOTE — ED Triage Notes (Signed)
Has had all 3 covid vaccines

## 2021-03-15 ENCOUNTER — Emergency Department (HOSPITAL_COMMUNITY): Payer: Self-pay

## 2021-03-15 ENCOUNTER — Other Ambulatory Visit: Payer: Self-pay

## 2021-03-15 ENCOUNTER — Encounter (HOSPITAL_COMMUNITY): Payer: Self-pay | Admitting: Emergency Medicine

## 2021-03-15 ENCOUNTER — Emergency Department (HOSPITAL_COMMUNITY)
Admission: EM | Admit: 2021-03-15 | Discharge: 2021-03-15 | Disposition: A | Discharge status: Home / Self Care | Payer: Self-pay | Attending: Emergency Medicine | Admitting: Emergency Medicine

## 2021-03-15 DIAGNOSIS — F17200 Nicotine dependence, unspecified, uncomplicated: Secondary | ICD-10-CM | POA: Insufficient documentation

## 2021-03-15 DIAGNOSIS — M79672 Pain in left foot: Secondary | ICD-10-CM

## 2021-03-15 DIAGNOSIS — M25562 Pain in left knee: Secondary | ICD-10-CM

## 2021-03-15 MED ORDER — MELOXICAM 7.5 MG PO TABS: 7.5000 mg | ORAL_TABLET | Freq: Every day | ORAL | 0 refills | Status: AC

## 2021-03-15 NOTE — ED Triage Notes (Signed)
Pt tripped over a ramp and fell 2 weeks ago.  C/o pain to L foot and L knee.

## 2021-03-15 NOTE — ED Provider Notes (Addendum)
[facility] EMERGENCY DEPARTMENT Provider Note   CSN: [id number] Arrival date & time: 03/15/21  1044     History Chief Complaint  Patient presents with  . Foot Pain  . Knee Pain    Tonya White is a 60 y.o. female.  Patient presents the emergency department for evaluation of persistent left foot and left knee and lower leg pain starting about 2 weeks ago.  Patient tripped on a ramp while entering her house.  She sustained a small abrasion to her right great toe that bled.  This area is healed well.  She initially had pain on the bottom of her left foot that feels worse when she walks on it.  She states that it feels like there is a ball in her foot after she stands on it for a couple of hours.  Several days after that she developed pain in her proximal tibial area, medially.  This area is just sore.  She is able to ambulate.  She has been taking over-the-counter ibuprofen and Tylenol which helps the pain for about 4 hours and then it comes back.  No redness or color changes.  No numbness or tingling.  She has not seen anyone for this.  Onset of symptoms acute.  Patient is primarily Spanish-speaking.  Offered interpreter.  She voiced that she would like her son, who is at bedside, to interpret for her.        History reviewed. No pertinent past medical history.  There are no problems to display for this patient.   History reviewed. No pertinent surgical history.   OB History   No obstetric history on file.     No family history on file.  Social History   Tobacco Use  . Smoking status: Current Every Day Smoker  . Smokeless tobacco: Never Used  Substance Use Topics  . Alcohol use: No  . Drug use: No    Home Medications Prior to Admission medications   Medication Sig Start Date End Date Taking? Authorizing Provider  meloxicam (MOBIC) 7.5 MG tablet Take 1 tablet (7.5 mg total) by mouth daily. 03/15/21  Yes Renne Crigler, PA-C  EPINEPHrine  0.3 mg/0.3 mL IJ SOAJ injection Inject 0.3 mg into the muscle as needed for anaphylaxis. 11/28/20   Tonia Brooms, MD  cetirizine (ZYRTEC ALLERGY) 10 MG tablet Take 1 tablet (10 mg total) by mouth daily. 01/02/17 03/15/21  Linwood Dibbles, MD    Allergies    Patient has no known allergies.  Review of Systems   Review of Systems  Constitutional: Negative for activity change.  Musculoskeletal: Positive for arthralgias. Negative for joint swelling.  Skin: Negative for wound.  Neurological: Negative for weakness and numbness.    Physical Exam Updated Vital Signs BP 115/70 (BP Location: Right Arm)   Pulse 65   Temp 98.1 F (36.7 C) (Oral)   Resp 18   SpO2 98%   Physical Exam Vitals and nursing note reviewed.  Constitutional:      Appearance: She is well-developed.  HENT:     Head: Normocephalic and atraumatic.  Eyes:     Pupils: Pupils are equal, round, and reactive to light.  Cardiovascular:     Pulses: Normal pulses. No decreased pulses.  Musculoskeletal:        General: Tenderness present.     Cervical back: Normal range of motion and neck supple.     Right foot: No tenderness or bony tenderness.     Left foot:  Tenderness present. No bony tenderness.     Comments: R foot: healing abrasion great toe L foot: mild tenderness, mid-foot, plantar aspect. No ecchymosis, erythema. Left knee: There is mild tenderness over the proximal tibia, medially.  No ecchymosis or deformity.  No joint line tenderness.  Skin:    General: Skin is warm and dry.  Neurological:     Mental Status: She is alert.     Sensory: No sensory deficit.     Comments: Motor, sensation, and vascular distal to the injury is fully intact.      ED Results / Procedures / Treatments   Labs (all labs ordered are listed, but only abnormal results are displayed) Labs Reviewed - No data to display  EKG None  Radiology DG Knee 2 Views Left  Result Date: 03/15/2021 CLINICAL DATA:  Patient tripped twisting her left  foot 2 weeks ago. Complaining of left foot pain. Also with left knee pain. EXAM: LEFT KNEE - 1-2 VIEW COMPARISON:  None. FINDINGS: No evidence of fracture, dislocation, or joint effusion. No evidence of arthropathy or other focal bone abnormality. Soft tissues are unremarkable. IMPRESSION: Negative. Electronically Signed   By: Amie Portland M.D.   On: 03/15/2021 12:11   DG Foot Complete Left  Result Date: 03/15/2021 CLINICAL DATA:  Post fall. EXAM: LEFT FOOT - COMPLETE 3+ VIEW COMPARISON:  None. FINDINGS: There is no evidence of fracture or dislocation. There is no evidence of arthropathy or other focal bone abnormality. Soft tissues are unremarkable. IMPRESSION: Negative. Electronically Signed   By: Ted Mcalpine M.D.   On: 03/15/2021 13:34    Procedures Procedures   Medications Ordered in ED Medications - No data to display  ED Course  I have reviewed the triage vital signs and the nursing notes.  Pertinent labs & imaging results that were available during my care of the patient were reviewed by me and considered in my medical decision making (see chart for details).  Patient seen and examined.  Awaiting imaging results ordered at triage.  Vital signs reviewed and are as follows: BP 115/70 (BP Location: Right Arm)   Pulse 65   Temp 98.1 F (36.7 C) (Oral)   Resp 18   SpO2 98%   I had a call over to reading room to have one of the studies read.  Updated family and patient on results.  At this point no fractures.  Will place on a course of meloxicam.  Referral to podiatry given.  Patient encouraged to rest and avoid strenuous activities on her foot if possible.  We discussed gentle stretching with a tennis ball or other round object.  Encouraged podiatry/PCP follow-up, especially if symptoms are not improving.  She verbalizes understanding agrees with plan.    MDM Rules/Calculators/A&P                          Patient with left foot and knee pain starting after an injury 2  weeks ago.  Imaging is negative.  Lower extremity is neurovascularly intact.  Low concern for occult tibial plateau fracture.  Exam is concerning for sprain of the foot and contusion of the knee.  Treatment plan with RICE protolcol, NSAIDs, podiatry follow-up as above.  Patient looks well.   Final Clinical Impression(s) / ED Diagnoses Final diagnoses:  Left foot pain  Acute pain of left knee    Rx / DC Orders ED Discharge Orders         Ordered  meloxicam (MOBIC) 7.5 MG tablet  Daily        03/15/21 1356             Renne Crigler, [place] 03/15/21 1530    Tegeler, Canary Brim, MD 03/15/21 1537

## 2021-03-15 NOTE — Discharge Instructions (Signed)
Please read and follow all provided instructions.  Your diagnoses today include:  1. Left foot pain   2. Acute pain of left knee     Tests performed today include:  An x-ray of the affected area - does NOT show any broken bones  Vital signs. See below for your results today.   Medications prescribed:   Meloxicam - anti-inflammatory pain medication  You have been prescribed an anti-inflammatory medication or NSAID. Take with food. Do not take aspirin, ibuprofen, or naproxen if taking this medication. Take smallest effective dose for the shortest duration needed for your pain. Stop taking if you experience stomach pain or vomiting.   Take any prescribed medications only as directed.  Home care instructions:   Follow any educational materials contained in this packet  Follow R.I.C.E. Protocol:  R - rest your injury   I  - use ice on injury without applying directly to skin  C - compress injury with bandage or splint  E - elevate the injury as much as possible  Follow-up instructions: Please follow-up with the provided podiatrist (bone specialist) if you continue to have significant pain in 1 week. In this case you may have a more severe injury that requires further care.   Return instructions:   Please return if your toes or feet are numb or tingling, appear gray or blue, or you have severe pain (also elevate the leg and loosen splint or wrap if you were given one)  Please return to the Emergency Department if you experience worsening symptoms.   Please return if you have any other emergent concerns.  Additional Information:  Your vital signs today were: BP 115/70 (BP Location: Right Arm)   Pulse 65   Temp 98.1 F (36.7 C) (Oral)   Resp 18   SpO2 98%  If your blood pressure (BP) was elevated above 135/85 this visit, please have this repeated by your doctor within one month. --------------

## 2021-03-15 NOTE — ED Provider Notes (Signed)
Emergency Medicine Provider Triage Evaluation Note  Tonya White , a 60 y.o. female  was evaluated in triage.  Pt complains of foot injury.  Review of Systems  Positive: L foot pain, L knee pain Negative: Hip pain, numbness  Physical Exam  BP 115/70 (BP Location: Right Arm)   Pulse 65   Temp 98.1 F (36.7 C) (Oral)   Resp 18   SpO2 98%  Gen:   Awake, no distress   Resp:  Normal effort  MSK:   Moves extremities without difficulty  Other:  L foot: ttp to mid sole, no deformity.  L knee, mild anterior tenderness  Medical Decision Making  Medically screening exam initiated at 11:30 AM.  Appropriate orders placed.  Tonya White was informed that the remainder of the evaluation will be completed by another provider, this initial triage assessment does not replace that evaluation, and the importance of remaining in the ED until their evaluation is complete.  Tripped on ramp and fell 2 weeks ago.  Progressive worsening pain to L foot and L knee.    Fayrene Helper, PA-C 03/15/21 1131    Horton, Clabe Seal, DO 03/15/21 1517

## 2022-07-18 IMAGING — CR DG FOOT COMPLETE 3+V*L*
3 series · 3 of 3 positions shown · non-contrast
Comparison: None.

CLINICAL DATA: Post fall.

EXAM:
LEFT FOOT - COMPLETE 3+ VIEW

[foot ap]
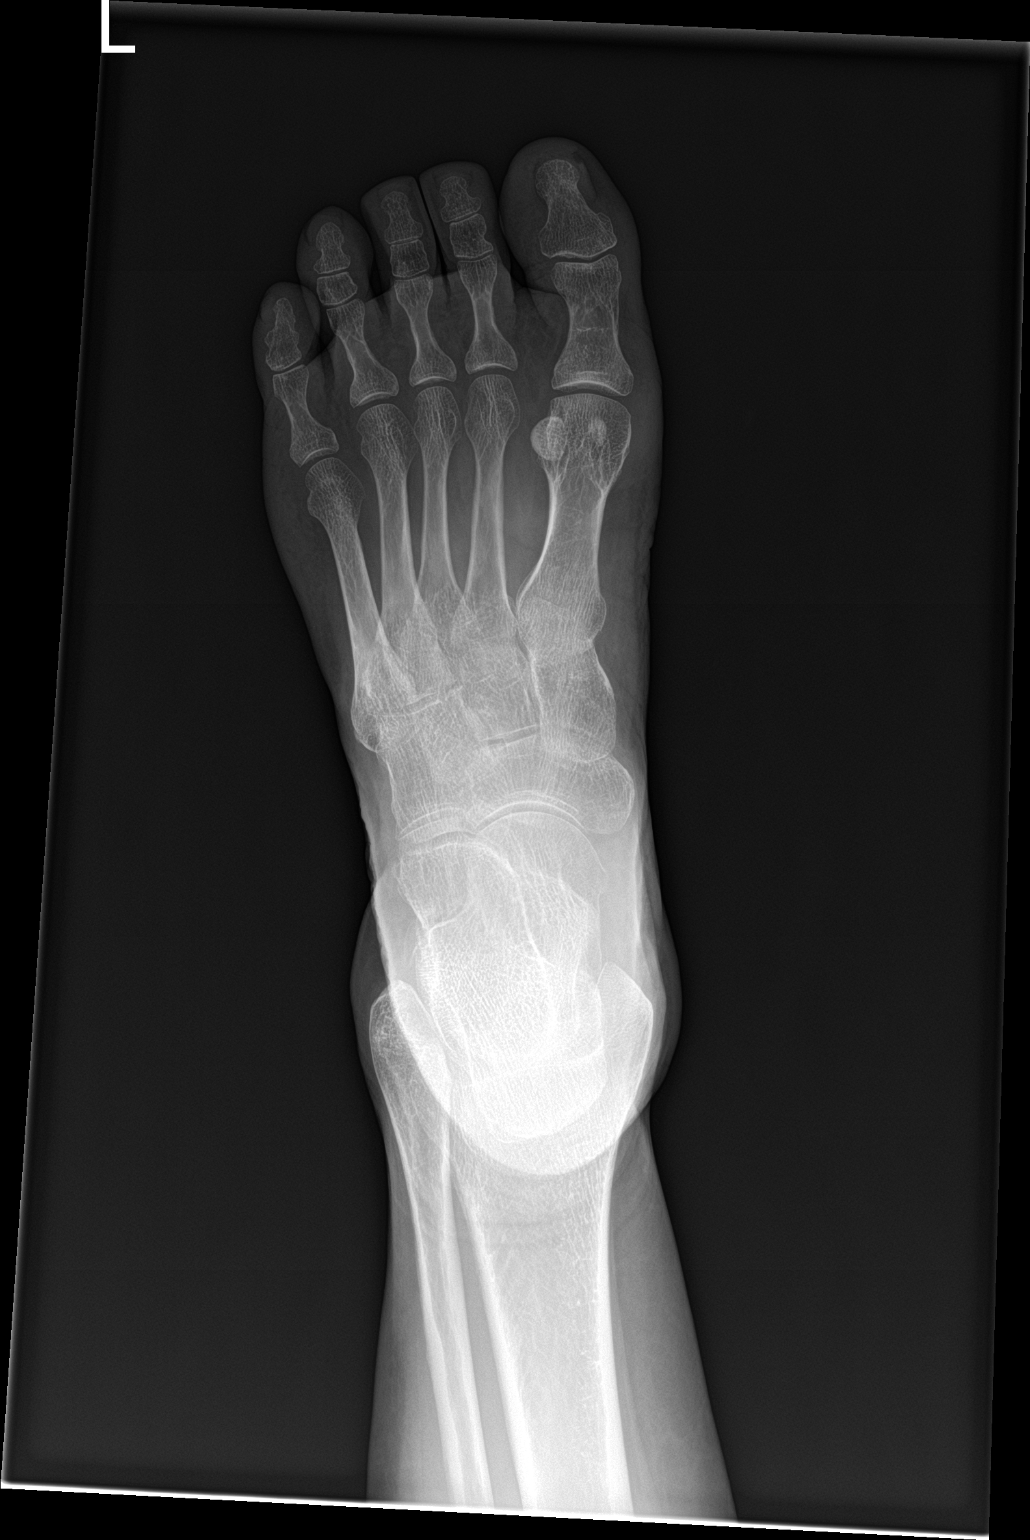

[foot obl]
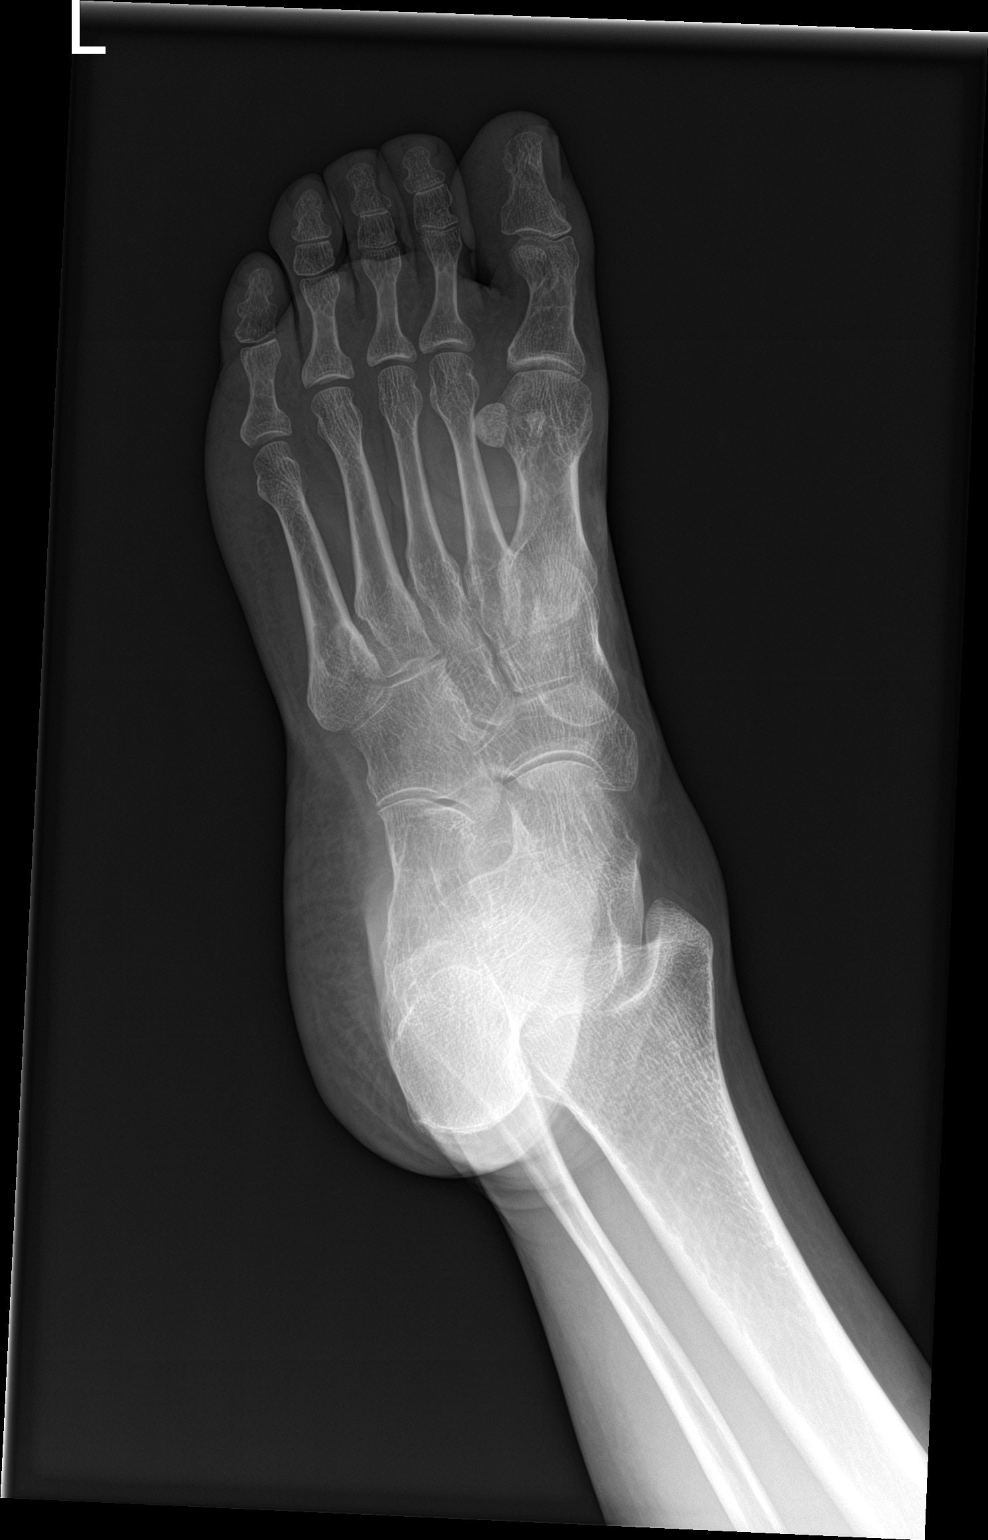

[foot lat]
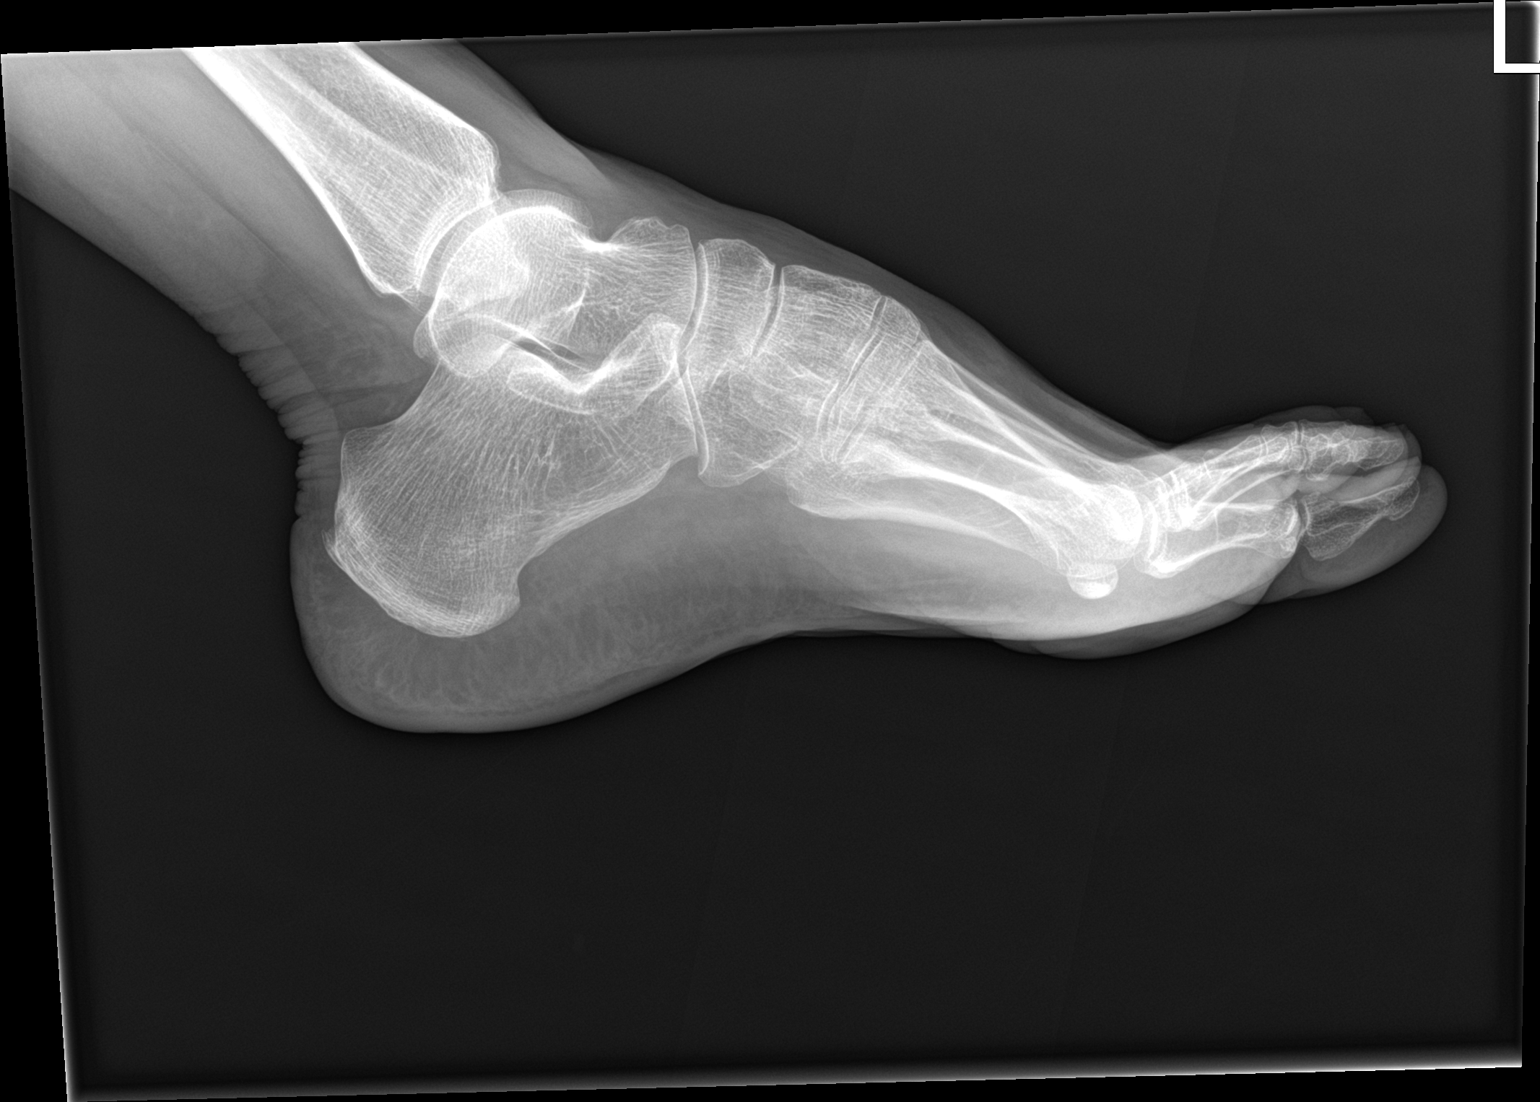

[3 of 3 positions shown; findings below may reference images not displayed]

FINDINGS: There is no evidence of fracture or dislocation. There is no
evidence of arthropathy or other focal bone abnormality. Soft
tissues are unremarkable.
IMPRESSION: Negative.

## 2022-07-18 IMAGING — CR DG KNEE 1-2V*L*
3 series · 3 of 3 positions shown · non-contrast
Comparison: None.

CLINICAL DATA: Patient tripped twisting her left foot 2 weeks ago.
Complaining of left foot pain. Also with left knee pain.

EXAM:
LEFT KNEE - 1-2 VIEW

[knee ap]
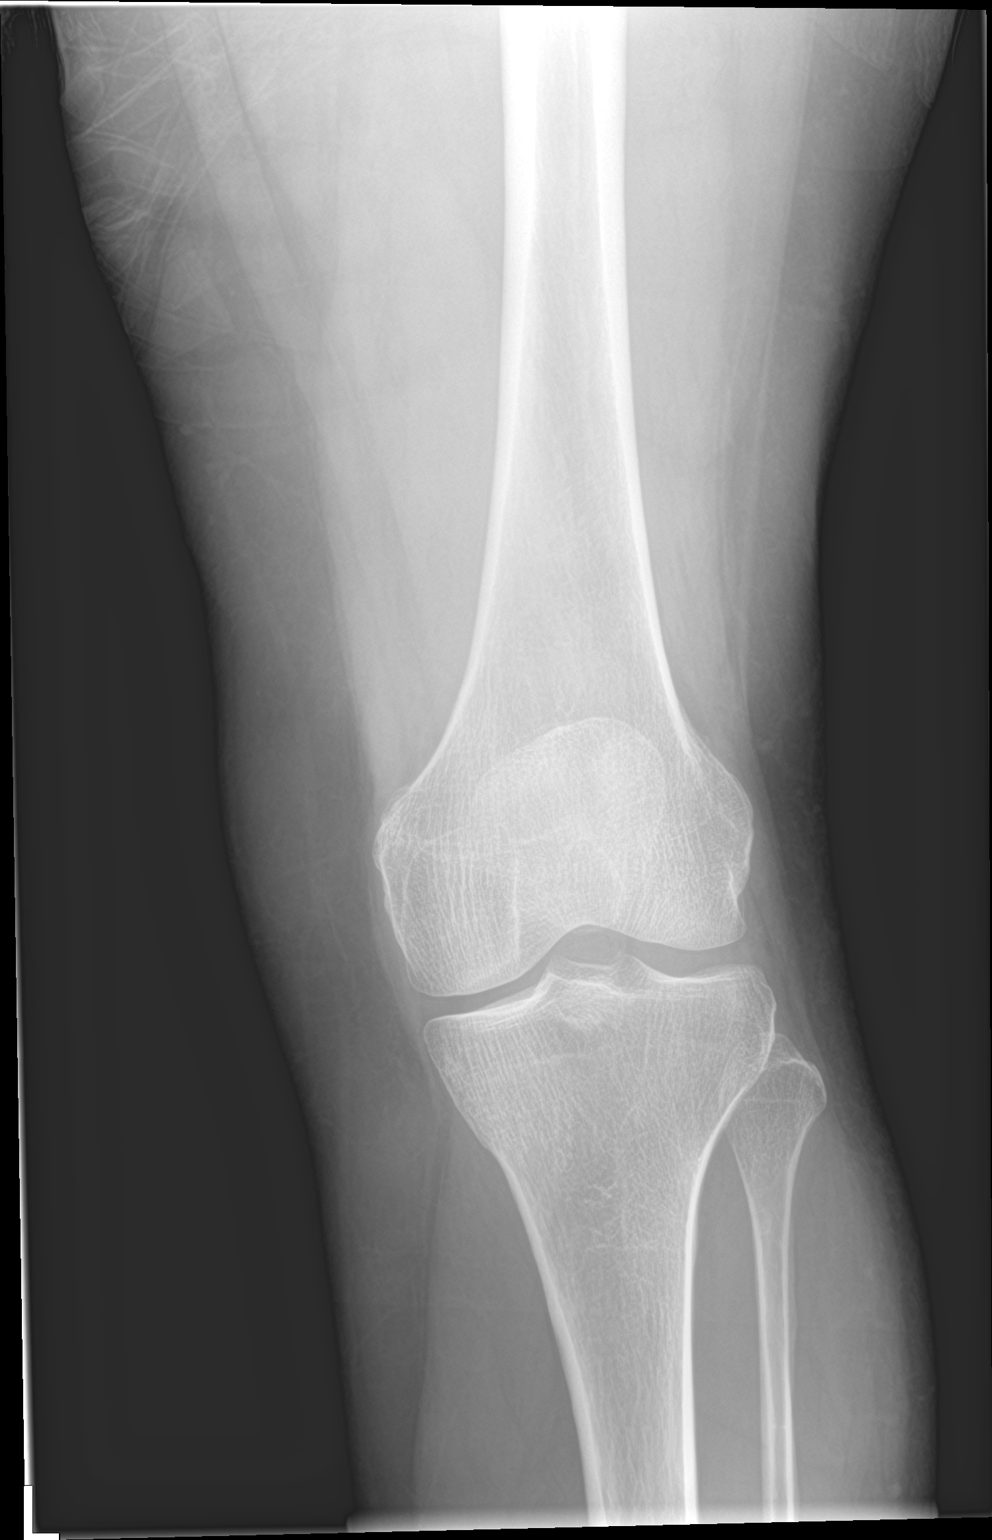

[knee lat (1 of 2)]
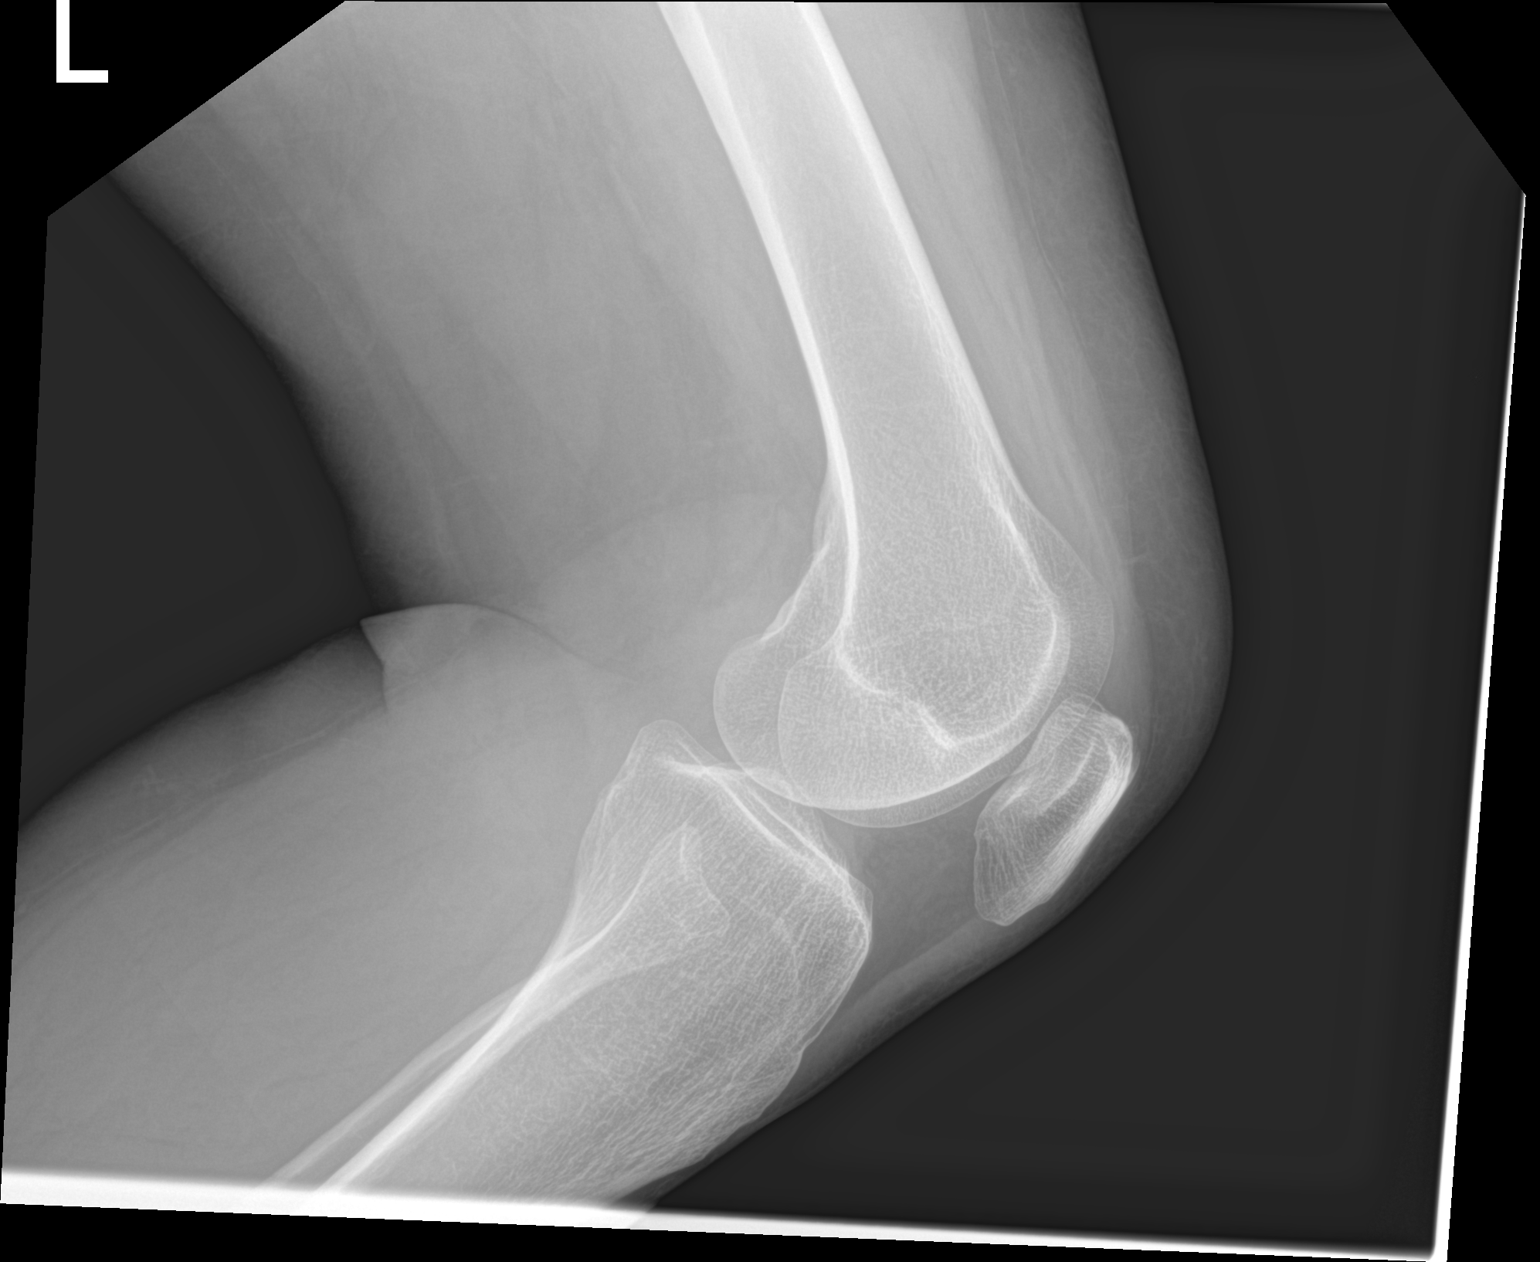

[knee lat (2 of 2)]
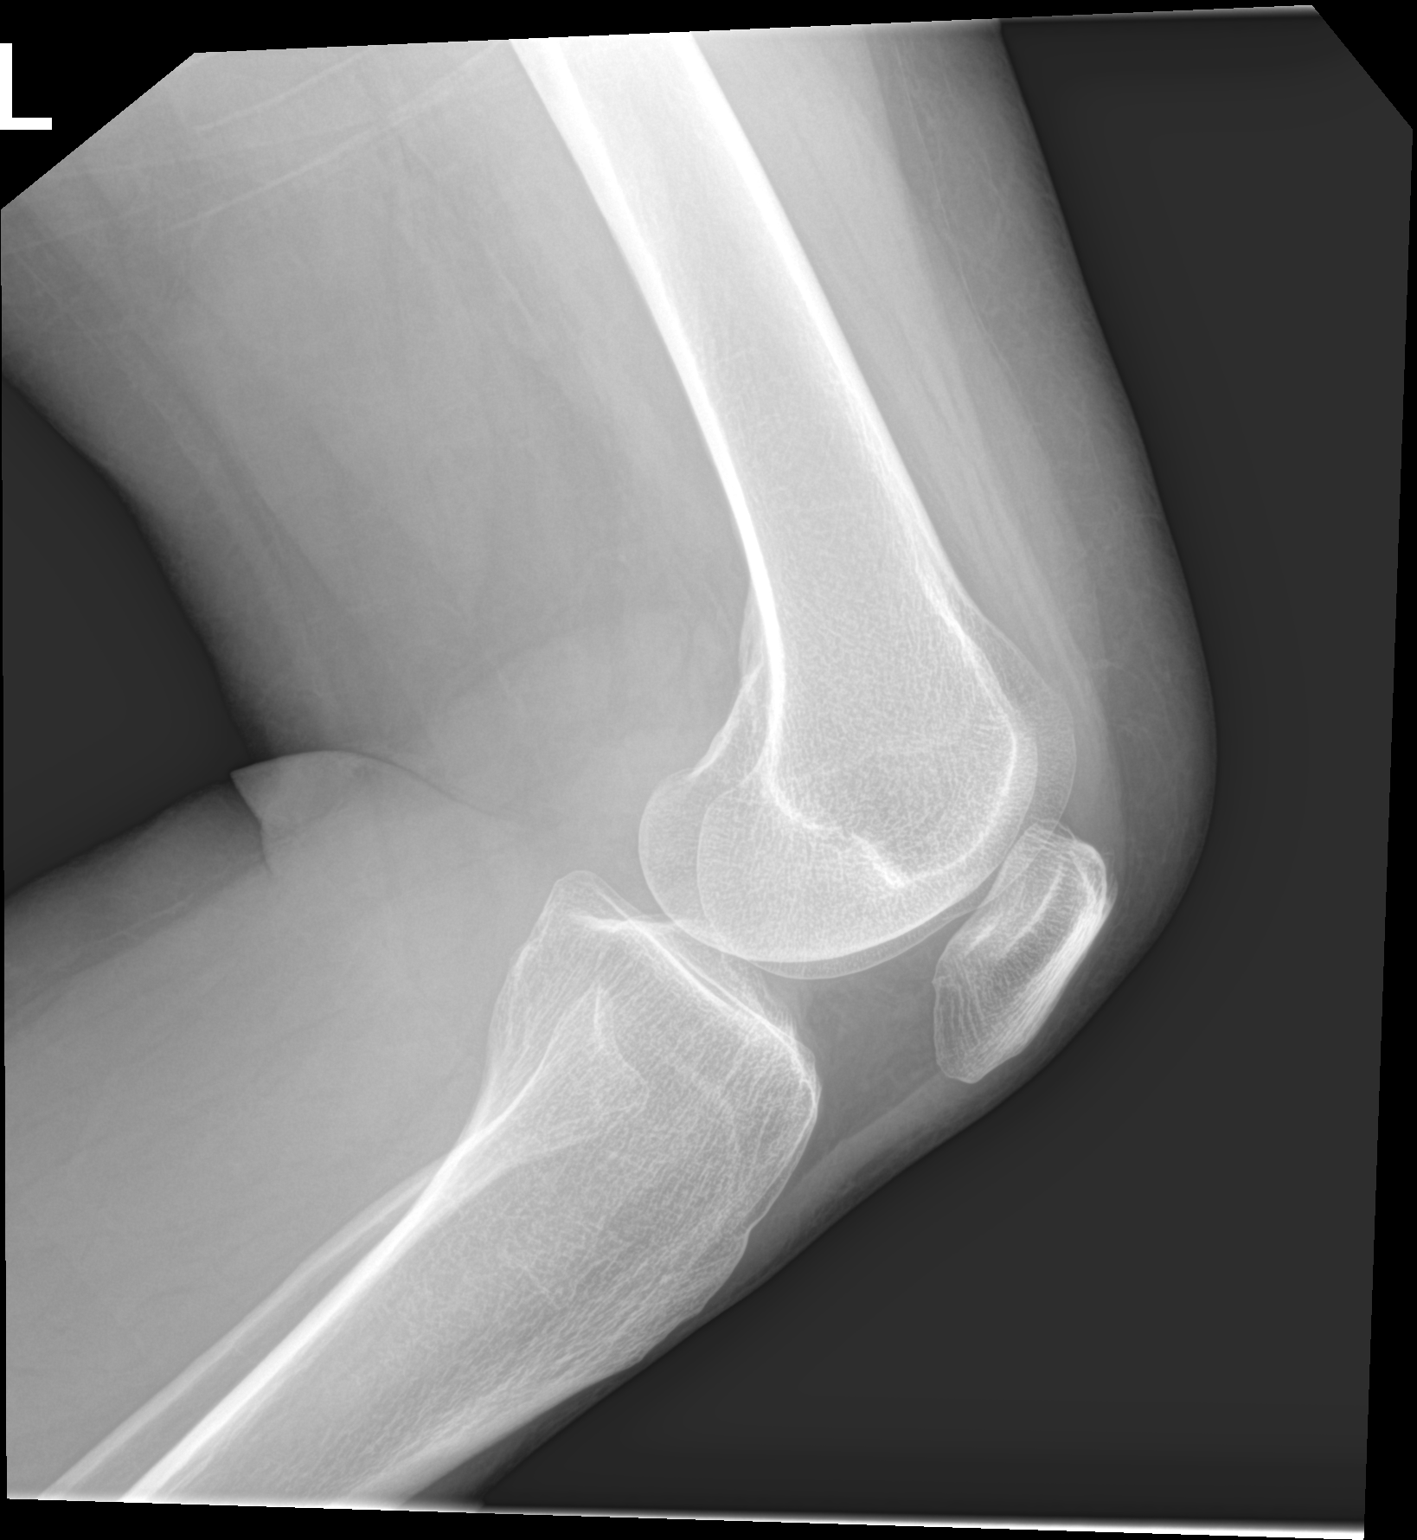

[3 of 3 positions shown; findings below may reference images not displayed]

FINDINGS: No evidence of fracture, dislocation, or joint effusion. No evidence
of arthropathy or other focal bone abnormality. Soft tissues are
unremarkable.
IMPRESSION: Negative.

## 2022-12-22 ENCOUNTER — Ambulatory Visit: Payer: Self-pay | Admitting: Internal Medicine

## 2022-12-28 ENCOUNTER — Ambulatory Visit: Payer: Self-pay | Admitting: Internal Medicine

## 2023-03-31 ENCOUNTER — Ambulatory Visit: Payer: Self-pay | Admitting: Internal Medicine

## 2023-07-21 ENCOUNTER — Telehealth: Payer: Self-pay

## 2023-07-21 NOTE — Telephone Encounter (Signed)
Patient's new patient appointment was rescheduled. Patient would like to be on wait list for a sooner appointment  Patient is available any day in the morning.  We will call patient if there is a cancellation.

## 2023-07-28 ENCOUNTER — Ambulatory Visit: Payer: Self-pay | Admitting: Internal Medicine

## 2023-08-27 NOTE — Telephone Encounter (Signed)
Offer patient an appointment patient did not take. Patient would like to call us back once she has her orange card first .

## 2024-01-10 ENCOUNTER — Encounter: Payer: Self-pay | Admitting: Internal Medicine

## 2024-01-10 ENCOUNTER — Ambulatory Visit: Payer: Self-pay | Admitting: Internal Medicine

## 2024-01-10 VITALS — BP 100/80 | HR 85 | Resp 16 | Ht 61.0 in | Wt 143.0 lb

## 2024-01-10 DIAGNOSIS — J302 Other seasonal allergic rhinitis: Secondary | ICD-10-CM

## 2024-01-10 DIAGNOSIS — K029 Dental caries, unspecified: Secondary | ICD-10-CM

## 2024-01-10 DIAGNOSIS — T7840XA Allergy, unspecified, initial encounter: Secondary | ICD-10-CM | POA: Insufficient documentation

## 2024-01-10 DIAGNOSIS — Z7689 Persons encountering health services in other specified circumstances: Secondary | ICD-10-CM
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
## 2024-01-11 ENCOUNTER — Ambulatory Visit: Payer: Self-pay | Admitting: Internal Medicine

## 2024-01-19 LAB — CBC WITH DIFFERENTIAL/PLATELET
Basophils Absolute: 0 10*3/uL (ref 0.0–0.2)
Basos: 1 %
EOS (ABSOLUTE): 0.3 10*3/uL (ref 0.0–0.4)
Eos: 5 %
Hematocrit: 41.2 % (ref 34.0–46.6)
Hemoglobin: 13.5 g/dL (ref 11.1–15.9)
Immature Grans (Abs): 0 10*3/uL (ref 0.0–0.1)
Immature Granulocytes: 0 %
Lymphocytes Absolute: 1.6 10*3/uL (ref 0.7–3.1)
Lymphs: 32 %
MCH: 29.4 pg (ref 26.6–33.0)
MCHC: 32.8 g/dL (ref 31.5–35.7)
MCV: 90 fL (ref 79–97)
Monocytes Absolute: 0.4 10*3/uL (ref 0.1–0.9)
Monocytes: 8 %
Neutrophils Absolute: 2.7 10*3/uL (ref 1.4–7.0)
Neutrophils: 54 %
Platelets: 343 10*3/uL (ref 150–450)
RBC: 4.59 x10E6/uL (ref 3.77–5.28)
RDW: 13.7 % (ref 11.7–15.4)
WBC: 5.1 10*3/uL (ref 3.4–10.8)

## 2024-01-19 LAB — COMPREHENSIVE METABOLIC PANEL WITH GFR
ALT: 14 IU/L (ref 0–32)
AST: 19 IU/L (ref 0–40)
Albumin: 4.2 g/dL (ref 3.8–4.9)
Alkaline Phosphatase: 129 IU/L — ABNORMAL HIGH (ref 44–121)
BUN/Creatinine Ratio: 21 (ref 12–28)
BUN: 13 mg/dL (ref 8–27)
Bilirubin Total: 0.3 mg/dL (ref 0.0–1.2)
CO2: 22 mmol/L (ref 20–29)
Calcium: 9 mg/dL (ref 8.7–10.3)
Chloride: 106 mmol/L (ref 96–106)
Creatinine, Ser: 0.62 mg/dL (ref 0.57–1.00)
Globulin, Total: 2.5 g/dL (ref 1.5–4.5)
Glucose: 102 mg/dL — ABNORMAL HIGH (ref 70–99)
Potassium: 4 mmol/L (ref 3.5–5.2)
Sodium: 144 mmol/L (ref 134–144)
Total Protein: 6.7 g/dL (ref 6.0–8.5)
eGFR: 102 mL/min/{1.73_m2} (ref >59)

## 2024-01-19 LAB — LIPID PANEL W/O CHOL/HDL RATIO
Cholesterol, Total: 216 mg/dL — ABNORMAL HIGH (ref 100–199)
HDL: 46 mg/dL (ref >39)
LDL Chol Calc (NIH): 137 mg/dL — ABNORMAL HIGH (ref 0–99)
Triglycerides: 183 mg/dL — ABNORMAL HIGH (ref 0–149)
VLDL Cholesterol Cal: 33 mg/dL (ref 5–40)

## 2024-01-31 ENCOUNTER — Encounter: Payer: Self-pay | Admitting: Internal Medicine

## 2024-01-31 DIAGNOSIS — E785 Hyperlipidemia, unspecified: Secondary | ICD-10-CM | POA: Insufficient documentation

## 2024-07-20 ENCOUNTER — Encounter: Payer: Self-pay | Admitting: Internal Medicine
